# Patient Record
Sex: Female | Born: 1975 | Race: Black or African American | Hispanic: No | Marital: Single | Smoking: Current every day smoker
Health system: Southern US, Community
[De-identification: ages and names within clinical notes are randomized; demographics above are authoritative.]

## PROBLEM LIST (undated history)

## (undated) DIAGNOSIS — I10 Essential (primary) hypertension: Secondary | ICD-10-CM

## (undated) HISTORY — PX: TUBAL LIGATION: SHX77

---

## 2011-03-25 ENCOUNTER — Emergency Department (HOSPITAL_BASED_OUTPATIENT_CLINIC_OR_DEPARTMENT_OTHER)
Admission: EM | Admit: 2011-03-25 | Discharge: 2011-03-25 | Disposition: A | Discharge status: Home / Self Care | Payer: Medicaid Other | Attending: Emergency Medicine | Admitting: Emergency Medicine

## 2011-03-25 DIAGNOSIS — F17200 Nicotine dependence, unspecified, uncomplicated: Secondary | ICD-10-CM | POA: Insufficient documentation

## 2011-03-25 DIAGNOSIS — J329 Chronic sinusitis, unspecified: Secondary | ICD-10-CM | POA: Insufficient documentation

## 2016-05-05 ENCOUNTER — Encounter (HOSPITAL_BASED_OUTPATIENT_CLINIC_OR_DEPARTMENT_OTHER): Payer: Self-pay | Admitting: *Deleted

## 2016-05-05 ENCOUNTER — Emergency Department (HOSPITAL_BASED_OUTPATIENT_CLINIC_OR_DEPARTMENT_OTHER)
Admission: EM | Admit: 2016-05-05 | Discharge: 2016-05-05 | Disposition: A | Discharge status: Home / Self Care | Payer: Medicaid Other | Attending: Emergency Medicine | Admitting: Emergency Medicine

## 2016-05-05 DIAGNOSIS — Y939 Activity, unspecified: Secondary | ICD-10-CM | POA: Diagnosis not present

## 2016-05-05 DIAGNOSIS — S91311A Laceration without foreign body, right foot, initial encounter: Secondary | ICD-10-CM | POA: Diagnosis not present

## 2016-05-05 DIAGNOSIS — W450XXA Nail entering through skin, initial encounter: Secondary | ICD-10-CM | POA: Diagnosis not present

## 2016-05-05 DIAGNOSIS — Y999 Unspecified external cause status: Secondary | ICD-10-CM | POA: Insufficient documentation

## 2016-05-05 DIAGNOSIS — F17210 Nicotine dependence, cigarettes, uncomplicated: Secondary | ICD-10-CM | POA: Diagnosis not present

## 2016-05-05 DIAGNOSIS — L0291 Cutaneous abscess, unspecified: Secondary | ICD-10-CM

## 2016-05-05 DIAGNOSIS — L02411 Cutaneous abscess of right axilla: Secondary | ICD-10-CM | POA: Insufficient documentation

## 2016-05-05 DIAGNOSIS — IMO0002 Reserved for concepts with insufficient information to code with codable children: Secondary | ICD-10-CM

## 2016-05-05 DIAGNOSIS — I10 Essential (primary) hypertension: Secondary | ICD-10-CM | POA: Insufficient documentation

## 2016-05-05 DIAGNOSIS — Y929 Unspecified place or not applicable: Secondary | ICD-10-CM | POA: Diagnosis not present

## 2016-05-05 HISTORY — DX: Essential (primary) hypertension: I10

## 2016-05-05 MED ORDER — SULFAMETHOXAZOLE-TRIMETHOPRIM 800-160 MG PO TABS: 1.0000 | ORAL_TABLET | Freq: Two times a day (BID) | ORAL | Status: AC

## 2016-05-05 MED ORDER — LIDOCAINE-EPINEPHRINE (PF) 2 %-1:200000 IJ SOLN
10.0000 mL | Freq: Once | INTRAMUSCULAR | Status: AC
  Administered 2016-05-05: 10 mL
  Filled 2016-05-05: qty 10

## 2016-05-05 MED ORDER — TETANUS-DIPHTH-ACELL PERTUSSIS 5-2.5-18.5 LF-MCG/0.5 IM SUSP
0.5000 mL | Freq: Once | INTRAMUSCULAR | Status: AC
  Administered 2016-05-05: 0.5 mL via INTRAMUSCULAR
  Filled 2016-05-05: qty 0.5

## 2016-05-05 NOTE — Discharge Instructions (Signed)
Keep wound clean using Dial antibacterial soap and water and pat dry.  You may take Ibuprofen and/or Tylenol as prescribed over the counter as needed for pain relief. You may continue using warm compresses as needed. Please follow up with a primary care provider from the Resource Guide provided below in 4-5 days if your symptoms have not improved. Please return to the Emergency Department if symptoms worsen or new onset of fever, swelling, redness, drainage, numbness, tingling.

## 2016-05-05 NOTE — ED Notes (Signed)
Patient states she has a three day old cut on the heel of her right foot from a nail in her bed frame.  Also has an axillary abscess under her right arm.

## 2016-05-05 NOTE — ED Provider Notes (Signed)
CSN: [id number]     Arrival date & time 05/05/16  1025 History   First MD Initiated Contact with Patient 05/05/16 1110     Chief Complaint  Patient presents with  . Extremity Laceration  . Abscess     (Consider location/radiation/quality/duration/timing/severity/associated sxs/prior Treatment) HPI   Patient is a 40 year old female with past medical history of hypertension who presents the ED with complaint of abscess. Patient reports having a worsening abscess to her right axilla. Patient endorses history of abscesses to her axillary region and groin. Denies fever, chills, swelling, redness, drainage, numbness, tingling. Patient denies taking any medications at home are using warm compresses. She also reports cutting her right heel on a nail that was sticking out from her bed frame 3 days ago. She states the wound was initially bleeding but was controlled with pressure. Patient reports cleaning her wound with water and peroxide. Endorses mild associated pain to the area. Denies bleeding, drainage, swelling, redness. Tetanus status unknown.   Past Medical History  Diagnosis Date  . Hypertension     Gestational htn   Past Surgical History  Procedure Laterality Date  . Cesarean section    . Tubal ligation     No family history on file. Social History  Substance Use Topics  . Smoking status: Current Every Day Smoker    Types: Cigarettes  . Smokeless tobacco: Never Used  . Alcohol Use: No   OB History    No data available     Review of Systems  Constitutional: Negative for fever.  Musculoskeletal: Negative for joint swelling.  Skin: Positive for wound (laceration).       Abscess  Neurological: Negative for numbness.      Allergies  Review of patient's allergies indicates no known allergies.  Home Medications   Prior to Admission medications   Not on File   BP 159/95 mmHg  Pulse 86  Temp(Src) 99.5 F (37.5 C) (Oral)  Resp 20  Ht 5\' 9"  (1.753 m)  Wt 68.04 kg   BMI 22.14 kg/m2  SpO2 100%  LMP 04/14/2016 (Exact Date) Physical Exam  Constitutional: She is oriented to person, place, and time. She appears well-developed and well-nourished.  HENT:  Head: Normocephalic and atraumatic.  Eyes: Conjunctivae and EOM are normal. Right eye exhibits no discharge. Left eye exhibits no discharge. No scleral icterus.  Neck: Normal range of motion. Neck supple.  Cardiovascular: Normal rate.   Pulmonary/Chest: Effort normal.  Abdominal: Soft. She exhibits no distension.  Musculoskeletal: Normal range of motion. She exhibits no edema or tenderness.       Right foot: There is laceration (healing). There is normal range of motion, no tenderness, no bony tenderness, no swelling, normal capillary refill, no crepitus and no deformity.       Feet:  FROM of right foot, ankle, toes. Sensation grossly intact. 2+ DP pulse. No swelling or contusion noted. No active bleeding or drainage.  Neurological: She is alert and oriented to person, place, and time.  Skin: Skin is warm and dry.  2x1cm abscess noted to right axilla with fluctuance and induration, no active drainage. No surrounding swelling or erythema noted.  Nursing note and vitals reviewed.   ED Course  .Marland Kitchen.Incision and Drainage Date/Time: 05/05/2016 12:39 PM Performed by: Barrett HenleNADEAU, NICOLE ELIZABETH Authorized by: Barrett HenleNADEAU, NICOLE ELIZABETH Consent: Verbal consent obtained. Risks and benefits: risks, benefits and alternatives were discussed Consent given by: patient Patient understanding: patient states understanding of the procedure being performed Patient identity confirmed: verbally  with patient Type: abscess Location: right axillary. Anesthesia: local infiltration Local anesthetic: lidocaine 2% with epinephrine Anesthetic total: 3 ml Scalpel size: 11 Needle gauge: 23. Incision type: single straight Complexity: simple Drainage: purulent Drainage amount: moderate Wound treatment: wound left open Packing  material: 1/2 in gauze Patient tolerance: Patient tolerated the procedure well with no immediate complications   (including critical care time) Labs Review Labs Reviewed - No data to display  Imaging Review No results found. I have personally reviewed and evaluated these images and lab results as part of my medical decision-making.   EKG Interpretation None      MDM   Final diagnoses:  Abscess  Laceration    Patient with skin abscess amenable to incision and drainage.  Abscess was not large enough to warrant packing or drain,  wound recheck in 2 days. Pt given info to follow up with PCP outpatient. Encouraged home warm soaks and flushing.  No signs of cellulitis is surrounding skin however, due to pt with hx of abscesses will d/c home with antibiotics. Plan to discharge patient home with wound care and discussed return precautions.  Patient presents with laceration that occurred over 3 days ago. Laceration is currently closed and appears to be healing well. Pt endorses cleaning wound with peroxide after onset. Due to laceration occurring more than 8 hours ago, laceration repair is contraindicated at this time. Tdap updated.  Pt has no comorbidities to effect normal wound healing. Discussed wound home care with patient and answered questions. Advised patient to follow up with a PCP as needed for her wound.    Satira Sarkicole Elizabeth DouglasNadeau, [place] JerseyPA-C 05/05/16 1251  Loren Raceravid Yelverton, MD 05/11/16 [id number] [id number]

## 2016-05-05 NOTE — ED Notes (Addendum)
Pt states she has been prescribed BP medication but does not take it.

## 2017-12-21 ENCOUNTER — Emergency Department (HOSPITAL_BASED_OUTPATIENT_CLINIC_OR_DEPARTMENT_OTHER)
Admission: EM | Admit: 2017-12-21 | Discharge: 2017-12-21 | Disposition: A | Discharge status: Home / Self Care | Payer: Self-pay | Attending: Physician Assistant | Admitting: Physician Assistant

## 2017-12-21 ENCOUNTER — Other Ambulatory Visit: Payer: Self-pay

## 2017-12-21 ENCOUNTER — Emergency Department (HOSPITAL_BASED_OUTPATIENT_CLINIC_OR_DEPARTMENT_OTHER): Payer: Self-pay

## 2017-12-21 ENCOUNTER — Encounter (HOSPITAL_BASED_OUTPATIENT_CLINIC_OR_DEPARTMENT_OTHER): Payer: Self-pay | Admitting: Emergency Medicine

## 2017-12-21 DIAGNOSIS — Y999 Unspecified external cause status: Secondary | ICD-10-CM | POA: Insufficient documentation

## 2017-12-21 DIAGNOSIS — X58XXXA Exposure to other specified factors, initial encounter: Secondary | ICD-10-CM | POA: Insufficient documentation

## 2017-12-21 DIAGNOSIS — T148XXA Other injury of unspecified body region, initial encounter: Secondary | ICD-10-CM

## 2017-12-21 DIAGNOSIS — Y929 Unspecified place or not applicable: Secondary | ICD-10-CM | POA: Insufficient documentation

## 2017-12-21 DIAGNOSIS — S29012A Strain of muscle and tendon of back wall of thorax, initial encounter: Secondary | ICD-10-CM | POA: Insufficient documentation

## 2017-12-21 DIAGNOSIS — J069 Acute upper respiratory infection, unspecified: Secondary | ICD-10-CM | POA: Insufficient documentation

## 2017-12-21 DIAGNOSIS — Y939 Activity, unspecified: Secondary | ICD-10-CM | POA: Insufficient documentation

## 2017-12-21 DIAGNOSIS — F17210 Nicotine dependence, cigarettes, uncomplicated: Secondary | ICD-10-CM | POA: Insufficient documentation

## 2017-12-21 DIAGNOSIS — B9789 Other viral agents as the cause of diseases classified elsewhere: Secondary | ICD-10-CM | POA: Insufficient documentation

## 2017-12-21 DIAGNOSIS — I10 Essential (primary) hypertension: Secondary | ICD-10-CM | POA: Insufficient documentation

## 2017-12-21 MED ORDER — BENZONATATE 100 MG PO CAPS: 100.0000 mg | ORAL_CAPSULE | Freq: Three times a day (TID) | ORAL | 0 refills | Status: DC | PRN

## 2017-12-21 MED ORDER — CYCLOBENZAPRINE HCL 10 MG PO TABS: 10.0000 mg | ORAL_TABLET | Freq: Every evening | ORAL | 0 refills | Status: AC | PRN

## 2017-12-21 NOTE — ED Notes (Signed)
Pt c/o mid back pain onset last p.m. "after work". Denies injury, but has had a cough for several days. Non productive. FAmily members also sick. PA at bedside.

## 2017-12-21 NOTE — ED Triage Notes (Signed)
Pt reports midback pain (greater on left) that began yesterday - states she thinks she hurt it at work but cannot recall - pt states no workers comp. Associated cough. Denies fever or dysuria.

## 2017-12-21 NOTE — ED Notes (Signed)
ED Provider at bedside. 

## 2017-12-21 NOTE — Discharge Instructions (Signed)
Please read instructions below. Apply ice or heat to your back for 20 minutes at a time. You can take advil every 6 hours as needed for pain. You can take flexeril as needed for muscle spasm. This medication can make you drowsy. You can take tessalon every 8 hours as needed for cough. Follow up with your primary care provider if symptoms persist.  Return to the ER for new or concerning symptoms.

## 2017-12-21 NOTE — ED Provider Notes (Signed)
[facility] EMERGENCY DEPARTMENT Provider Note   CSN: [id number] Arrival date & time: 12/21/17  1554     History   Chief Complaint Chief Complaint  Patient presents with  . Back Pain    HPI Donna Sanchez is a 42 y.o. female with past medical history of hypertension, presenting to the ED for acute onset of left-sided mid back pain that began yesterday.  Patient states she has had a cold for the past week, which she got from her children, and has been coughing frequently.  She states upon leaving work yesterday she began feeling sore in her left back that is been persistent since that time.  No known injuries that she can recall.  States cough is dry and nonproductive.  Denies fever, difficulty breathing, unilateral leg swelling, history of clot, exogenous estrogen use, recent travel or surgery.  The history is provided by the patient.    Past Medical History:  Diagnosis Date  . Hypertension    Gestational htn    There are no active problems to display for this patient.   Past Surgical History:  Procedure Laterality Date  . CESAREAN SECTION    . TUBAL LIGATION      OB History    No data available       Home Medications    Prior to Admission medications   Medication Sig Start Date End Date Taking? Authorizing Provider  benzonatate (TESSALON) 100 MG capsule Take 1 capsule (100 mg total) by mouth 3 (three) times daily as needed for cough. 12/21/17   Kais Monje, [place] N, PA-C  cyclobenzaprine (FLEXERIL) 10 MG tablet Take 1 tablet (10 mg total) by mouth at bedtime as needed for muscle spasms. 12/21/17   Arren Laminack, [place] N, PA-C    Family History History reviewed. No pertinent family history.  Social History Social History   Tobacco Use  . Smoking status: Current Every Day Smoker    Types: Cigarettes  . Smokeless tobacco: Never Used  Substance Use Topics  . Alcohol use: No  . Drug use: No     Allergies   Patient has no known allergies.   Review  of Systems Review of Systems  Constitutional: Negative for chills and fever.  Respiratory: Positive for cough. Negative for shortness of breath.   Cardiovascular: Negative for chest pain and leg swelling.  Musculoskeletal: Positive for back pain.  All other systems reviewed and are negative.    Physical Exam Updated Vital Signs BP (!) 145/97 (BP Location: Right Arm)   Pulse 82   Temp 98.3 F (36.8 C) (Oral)   Resp 18   Ht 5\' 9"  (1.753 m)   Wt 65.8 kg (145 lb)   LMP 12/21/2017   SpO2 100%   BMI 21.41 kg/m   Physical Exam  Constitutional: She appears well-developed and well-nourished. No distress.  Well-appearing, no inc work of breathing.  HENT:  Head: Normocephalic and atraumatic.  Eyes: Conjunctivae are normal.  Cardiovascular: Normal rate, regular rhythm, normal heart sounds and intact distal pulses.  Pulmonary/Chest: Effort normal and breath sounds normal. No stridor. No respiratory distress. She has no wheezes. She has no rales.  Musculoskeletal:  No spinal or paraspinal tenderness, no bony step-offs or gross deformities.  Muscular tenderness involving distal portion of left trapezius and right trapezius.  Neck with normal range of motion, bilateral upper extremities with normal range of motion, equal strength. No Lower extremity edema or tenderness.  Psychiatric: She has a normal mood and affect. Her behavior is  normal.  Nursing note and vitals reviewed.    ED Treatments / Results  Labs (all labs ordered are listed, but only abnormal results are displayed) Labs Reviewed - No data to display  EKG  EKG Interpretation None       Radiology Dg Chest 2 View  Result Date: 12/21/2017 CLINICAL DATA:  Smoker with cough x 1 week; no fever; back pain when coughing; no h/o lung or heart problems EXAM: CHEST  2 VIEW COMPARISON:  None. FINDINGS: Normal mediastinum and cardiac silhouette. Normal pulmonary vasculature. No evidence of effusion, infiltrate, or pneumothorax. No  acute bony abnormality. IMPRESSION: Normal chest radiograph. Electronically Signed   By: Genevive Bi M.D.   On: 12/21/2017 17:05    Procedures Procedures (including critical care time)  Medications Ordered in ED Medications - No data to display   Initial Impression / Assessment and Plan / ED Course  I have reviewed the triage vital signs and the nursing notes.  Pertinent labs & imaging results that were available during my care of the patient were reviewed by me and considered in my medical decision making (see chart for details).     Patient presenting with mid back pain, likely muscular strain secondary to viral upper respiratory infection with cough.  PERC negative, VSS.  No red flags on exam. Muscular tenderness noted, no midline spinal tenderness. Chest x-ray negative.  Discharge with symptomatic management including muscle relaxer and antitussive.  Well-appearing, safe for discharge.  Discussed results, findings, treatment and follow up. Patient advised of return precautions. Patient verbalized understanding and agreed with plan.  Final Clinical Impressions(s) / ED Diagnoses   Final diagnoses:  Muscle strain  Viral URI with cough    ED Discharge Orders        Ordered    cyclobenzaprine (FLEXERIL) 10 MG tablet  At bedtime PRN     12/21/17 1848    benzonatate (TESSALON) 100 MG capsule  3 times daily PRN     12/21/17 1848       Alaine Loughney, [place] N, PA-C 12/21/17 1848    Abelino Derrick, MD 12/21/17 2357

## 2018-04-29 ENCOUNTER — Other Ambulatory Visit: Payer: Self-pay

## 2018-04-29 ENCOUNTER — Encounter (HOSPITAL_BASED_OUTPATIENT_CLINIC_OR_DEPARTMENT_OTHER): Payer: Self-pay | Admitting: Adult Health

## 2018-04-29 ENCOUNTER — Emergency Department (HOSPITAL_BASED_OUTPATIENT_CLINIC_OR_DEPARTMENT_OTHER)
Admission: EM | Admit: 2018-04-29 | Discharge: 2018-04-29 | Disposition: A | Discharge status: Home / Self Care | Payer: Self-pay | Attending: Emergency Medicine | Admitting: Emergency Medicine

## 2018-04-29 ENCOUNTER — Emergency Department (HOSPITAL_BASED_OUTPATIENT_CLINIC_OR_DEPARTMENT_OTHER): Payer: Self-pay

## 2018-04-29 DIAGNOSIS — F17210 Nicotine dependence, cigarettes, uncomplicated: Secondary | ICD-10-CM | POA: Insufficient documentation

## 2018-04-29 DIAGNOSIS — J181 Lobar pneumonia, unspecified organism: Secondary | ICD-10-CM

## 2018-04-29 DIAGNOSIS — J189 Pneumonia, unspecified organism: Secondary | ICD-10-CM | POA: Insufficient documentation

## 2018-04-29 DIAGNOSIS — I10 Essential (primary) hypertension: Secondary | ICD-10-CM | POA: Insufficient documentation

## 2018-04-29 MED ORDER — DOXYCYCLINE HYCLATE 100 MG PO CAPS: 100.0000 mg | ORAL_CAPSULE | Freq: Two times a day (BID) | ORAL | 0 refills | Status: AC

## 2018-04-29 NOTE — ED Notes (Signed)
Patient transported to X-ray 

## 2018-04-29 NOTE — ED Triage Notes (Signed)
PResents with chills, bodyaches, cough and fatigue that began last night. Cough is productive with thick yellow mucous.

## 2018-04-29 NOTE — ED Notes (Signed)
ED Provider at bedside. 

## 2018-04-29 NOTE — ED Notes (Signed)
Pt reports increased fatigue and possible fevers where she is hott/cold.

## 2018-04-29 NOTE — ED Provider Notes (Signed)
[facility] EMERGENCY DEPARTMENT Provider Note   CSN: [id number] Arrival date & time: 04/29/18  1842     History   Chief Complaint Chief Complaint  Patient presents with  . Cough    HPI Donna Sanchez is a 42 y.o. female.  HPI Patient has a cough and felt bad since yesterday.  Fatigue body ache and chills.  Cough has sputum production that is "sweet".  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  Decreased appetite.  Dull chest pain.  But states that she aches everywhere.  No swelling in her legs. Past Medical History:  Diagnosis Date  . Hypertension    Gestational htn    There are no active problems to display for this patient.   Past Surgical History:  Procedure Laterality Date  . CESAREAN SECTION    . TUBAL LIGATION       OB History   None      Home Medications    Prior to Admission medications   Medication Sig Start Date End Date Taking? Authorizing Provider  benzonatate (TESSALON) 100 MG capsule Take 1 capsule (100 mg total) by mouth 3 (three) times daily as needed for cough. 12/21/17   Robinson, [place] N, PA-C  cyclobenzaprine (FLEXERIL) 10 MG tablet Take 1 tablet (10 mg total) by mouth at bedtime as needed for muscle spasms. 12/21/17   Robinson, [place] N, PA-C  doxycycline (VIBRAMYCIN) 100 MG capsule Take 1 capsule (100 mg total) by mouth 2 (two) times daily. 04/29/18   Benjiman CorePickering, Neo Yepiz, MD    Family History History reviewed. No pertinent family history.  Social History Social History   Tobacco Use  . Smoking status: Current Every Day Smoker    Types: Cigarettes  . Smokeless tobacco: Never Used  Substance Use Topics  . Alcohol use: No  . Drug use: No     Allergies   Patient has no known allergies.   Review of Systems Review of Systems  Constitutional: Positive for appetite change, chills and fatigue.  HENT: Negative for congestion.   Eyes: Negative for visual disturbance.  Respiratory: Positive for cough.     Cardiovascular: Negative for leg swelling.  Gastrointestinal: Negative for anal bleeding.  Endocrine: Negative for polyuria.  Genitourinary: Negative for flank pain.  Musculoskeletal: Positive for myalgias.  Skin: Negative for rash.  Neurological: Positive for weakness.  Hematological: Negative for adenopathy.  Psychiatric/Behavioral: Negative for confusion.     Physical Exam Updated Vital Signs BP (!) 143/84   Pulse (!) 102   Temp 99.8 F (37.7 C) (Oral)   Resp 17   Ht 5\' 9"  (1.753 m)   Wt 67.1 kg (148 lb)   LMP 04/20/2018 (Approximate)   SpO2 100%   BMI 21.86 kg/m   Physical Exam  Constitutional: She appears well-developed.  HENT:  Head: Normocephalic.  Eyes: EOM are normal.  Neck: Neck supple.  Cardiovascular:  Mild tachycardia  Pulmonary/Chest:  Mildly harsh breath sounds diffusely, scattered wheezes.  Abdominal: There is no tenderness.  Musculoskeletal: She exhibits no edema.  Neurological: She is alert.  Skin: Skin is warm. Capillary refill takes less than 2 seconds.     ED Treatments / Results  Labs (all labs ordered are listed, but only abnormal results are displayed) Labs Reviewed - No data to display  EKG None  Radiology Dg Chest 2 View  Result Date: 04/29/2018 CLINICAL DATA:  Cough and fever with body aches. EXAM: CHEST - 2 VIEW COMPARISON:  12/21/2017  FINDINGS: The heart size and mediastinal contours are within normal limits. Pulmonary consolidation in the medial right middle lobe. No parapneumonic effusion. No pneumothorax. The visualized skeletal structures are unremarkable. IMPRESSION: Right middle lobe pneumonia. Electronically Signed   By: Tollie Eth M.D.   On: 04/29/2018 19:34    Procedures Procedures (including critical care time)  Medications Ordered in ED Medications - No data to display   Initial Impression / Assessment and Plan / ED Course  I have reviewed the triage vital signs and the nursing notes.  Pertinent labs &  imaging results that were available during my care of the patient were reviewed by me and considered in my medical decision making (see chart for details).     Patient with chills cough and apparent pneumonia on x-ray.  Otherwise healthy.  Will discharge with antibiotics.  Not hypoxic.  Final Clinical Impressions(s) / ED Diagnoses   Final diagnoses:  Community acquired pneumonia of right middle lobe of lung [facility])    ED Discharge Orders        Ordered    doxycycline (VIBRAMYCIN) 100 MG capsule  2 times daily     04/29/18 2025       Benjiman Core, MD 04/29/18 2038

## 2020-11-08 ENCOUNTER — Encounter (HOSPITAL_BASED_OUTPATIENT_CLINIC_OR_DEPARTMENT_OTHER): Payer: Self-pay | Admitting: *Deleted

## 2020-11-08 ENCOUNTER — Other Ambulatory Visit: Payer: Self-pay

## 2020-11-08 ENCOUNTER — Emergency Department (HOSPITAL_BASED_OUTPATIENT_CLINIC_OR_DEPARTMENT_OTHER)
Admission: EM | Admit: 2020-11-08 | Discharge: 2020-11-08 | Disposition: A | Discharge status: Home / Self Care | Payer: BLUE CROSS/BLUE SHIELD | Attending: Emergency Medicine | Admitting: Emergency Medicine

## 2020-11-08 ENCOUNTER — Emergency Department (HOSPITAL_BASED_OUTPATIENT_CLINIC_OR_DEPARTMENT_OTHER): Payer: BLUE CROSS/BLUE SHIELD

## 2020-11-08 DIAGNOSIS — U071 COVID-19: Secondary | ICD-10-CM | POA: Diagnosis not present

## 2020-11-08 DIAGNOSIS — I10 Essential (primary) hypertension: Secondary | ICD-10-CM | POA: Diagnosis not present

## 2020-11-08 DIAGNOSIS — F17210 Nicotine dependence, cigarettes, uncomplicated: Secondary | ICD-10-CM | POA: Insufficient documentation

## 2020-11-08 DIAGNOSIS — R0981 Nasal congestion: Secondary | ICD-10-CM | POA: Diagnosis present

## 2020-11-08 DIAGNOSIS — R509 Fever, unspecified: Secondary | ICD-10-CM

## 2020-11-08 MED ORDER — ACETAMINOPHEN 325 MG PO TABS
650.0000 mg | ORAL_TABLET | Freq: Once | ORAL | Status: AC
  Administered 2020-11-08: 650 mg via ORAL
  Filled 2020-11-08: qty 2

## 2020-11-08 MED ORDER — FLUTICASONE PROPIONATE 50 MCG/ACT NA SUSP: 2.0000 | Freq: Every day | NASAL | 0 refills | Status: AC

## 2020-11-08 NOTE — ED Triage Notes (Signed)
C/o covid symptoms and upper back pain x 5 days, pending COvid test

## 2020-11-08 NOTE — ED Provider Notes (Signed)
[facility] EMERGENCY DEPARTMENT Provider Note   CSN: [id number] Arrival date & time: 11/08/20  1207     History Chief Complaint  Patient presents with  . Fever    Donna Sanchez is a 44 y.o. female.  HPI    Pt is a 44 y/o female who presents to the ED today for eval of myaglias for the last 5 days. States pain has mostly improved but she still has some diffuse back pain. Pain is constant and worse with movement and certain positions. She also reports some congestion, fatigue, malaise, chills, and generalized weakness. Denies sore throat, cough, headaches, fevers, vomiting, diarrhea. Denies chest pain or pleuritic pain.   Has not had covid vaccine. Denies any known exposures to covid.   Denies leg pain/swelling, hemoptysis, recent surgery/trauma, recent long travel,  personal hx of cancer, or hx of DVT/PE. Is on the depo shot.    Past Medical History:  Diagnosis Date  . Hypertension    Gestational htn    There are no problems to display for this patient.   Past Surgical History:  Procedure Laterality Date  . CESAREAN SECTION    . TUBAL LIGATION       OB History   No obstetric history on file.     No family history on file.  Social History   Tobacco Use  . Smoking status: Current Every Day Smoker    Types: Cigarettes  . Smokeless tobacco: Never Used  Substance Use Topics  . Alcohol use: No  . Drug use: No    Home Medications Prior to Admission medications   Medication Sig Start Date End Date Taking? Authorizing Provider  fluticasone (FLONASE) 50 MCG/ACT nasal spray Place 2 sprays into both nostrils daily. 11/08/20  Yes Jalexus Brett S, PA-C  benzonatate (TESSALON) 100 MG capsule Take 1 capsule (100 mg total) by mouth 3 (three) times daily as needed for cough. 12/21/17   Robinson, [place] N, PA-C  cyclobenzaprine (FLEXERIL) 10 MG tablet Take 1 tablet (10 mg total) by mouth at bedtime as needed for muscle spasms. 12/21/17   Robinson, [place] N,  PA-C  doxycycline (VIBRAMYCIN) 100 MG capsule Take 1 capsule (100 mg total) by mouth 2 (two) times daily. 04/29/18   Benjiman Core, MD    Allergies    Patient has no known allergies.  Review of Systems   Review of Systems  Constitutional: Positive for chills and fatigue. Negative for fever.  HENT: Positive for congestion. Negative for ear pain and sore throat.   Eyes: Negative for pain and visual disturbance.  Respiratory: Negative for cough and shortness of breath.   Cardiovascular: Negative for chest pain.  Gastrointestinal: Negative for abdominal pain, constipation, diarrhea, nausea and vomiting.  Genitourinary: Negative for dysuria and hematuria.  Musculoskeletal: Positive for back pain and myalgias.  Skin: Negative for rash.  Neurological: Negative for weakness and numbness.  All other systems reviewed and are negative.   Physical Exam Updated Vital Signs BP 131/81   Pulse 94   Temp (!) 101.9 F (38.8 C) (Oral)   Resp 20   Ht 5\' 9"  (1.753 m)   Wt 66.2 kg   LMP 11/01/2020   SpO2 100%   BMI 21.56 kg/m   Physical Exam Vitals and nursing note reviewed.  Constitutional:      General: She is not in acute distress.    Appearance: She is well-developed and well-nourished.  HENT:     Head: Normocephalic and atraumatic.  Eyes:  Conjunctiva/sclera: Conjunctivae normal.  Cardiovascular:     Rate and Rhythm: Normal rate and regular rhythm.     Heart sounds: Normal heart sounds. No murmur heard.   Pulmonary:     Effort: Pulmonary effort is normal. No respiratory distress.     Breath sounds: Normal breath sounds. No wheezing, rhonchi or rales.  Abdominal:     Palpations: Abdomen is soft.     Tenderness: There is no abdominal tenderness.  Musculoskeletal:        General: No edema.     Cervical back: Neck supple.  Skin:    General: Skin is warm and dry.  Neurological:     Mental Status: She is alert.  Psychiatric:        Mood and Affect: Mood and affect  normal.     ED Results / Procedures / Treatments   Labs (all labs ordered are listed, but only abnormal results are displayed) Labs Reviewed  SARS CORONAVIRUS 2 (TAT 6-24 HRS)    EKG None  Radiology DG Chest Portable 1 View  Result Date: 11/08/2020 CLINICAL DATA:  Fever, back pain EXAM: PORTABLE CHEST 1 VIEW COMPARISON:  06/08/2019 FINDINGS: The heart size and mediastinal contours are within normal limits. Both lungs are clear. The visualized skeletal structures are unremarkable. IMPRESSION: No active disease. Electronically Signed   By: Elige Ko   On: 11/08/2020 13:34    Procedures Procedures (including critical care time)  Medications Ordered in ED Medications  acetaminophen (TYLENOL) tablet 650 mg (650 mg Oral Given 11/08/20 1732)    ED Course  I have reviewed the triage vital signs and the nursing notes.  Pertinent labs & imaging results that were available during my care of the patient were reviewed by me and considered in my medical decision making (see chart for details).    MDM Rules/Calculators/A&P                          Patient presenting for evaluation for Covid.  Reports symptoms ongoing for several days days.  Patient nontoxic, well-appearing, no distress.  Vital signs are reassuring.  Chest x-ray does not show pneumonia, ptx or other acute abnormality.  Tested for Covid in the ED. Results pending at the time of discharge. Advised on quarantine measures. Will give Rx for symptomatic management. Advised on f/u and return precautions. Pt voiced understanding of the plan and reasons to return. All questions answered, pt stable for d/c.  [name] was evaluated in Emergency Department on 11/08/2020 for the symptoms described in the history of present illness. She was evaluated in the context of the global COVID-19 pandemic, which necessitated consideration that the patient might be at risk for infection with the SARS-CoV-2 virus that causes COVID-19.  Institutional protocols and algorithms that pertain to the evaluation of patients at risk for COVID-19 are in a state of rapid change based on information released by regulatory bodies including the CDC and federal and state organizations. These policies and algorithms were followed during the patient's care in the ED.   Final Clinical Impression(s) / ED Diagnoses Final diagnoses:  Febrile illness    Rx / DC Orders ED Discharge Orders         Ordered    fluticasone (FLONASE) 50 MCG/ACT nasal spray  Daily        11/08/20 1743           Karrie Meres, PA-C 11/08/20 1743    Chaney Malling  Hsienta, MD 11/08/20 2312

## 2020-11-08 NOTE — Discharge Instructions (Addendum)
Rotate Tylenol and Motrin for fevers. Use Fluticasone for nasal congestion.   Your COVID test is pending at the time of your discharge. You can see this result on your mychart.   If positive, you should be isolated for at least 7 days since the onset of your symptoms AND >72 hours after symptoms resolution (absence of fever without the use of fever reducing medicaiton and improvement in respiratory symptoms), whichever is longer  Please follow up with your primary care provider within 5-7 days for re-evaluation of your symptoms. If you do not have a primary care provider, information for a healthcare clinic has been provided for you to make arrangements for follow up care. Please return to the emergency department for any new or worsening symptoms.

## 2020-11-09 LAB — SARS CORONAVIRUS 2 (TAT 6-24 HRS): SARS Coronavirus 2: POSITIVE — AB

## 2020-11-11 ENCOUNTER — Telehealth (HOSPITAL_COMMUNITY): Payer: Self-pay

## 2021-07-24 IMAGING — DX DG CHEST 1V PORT
2 series · 2 of 2 positions shown · non-contrast
Comparison: 06/08/2019

CLINICAL DATA: Fever, back pain

EXAM:
PORTABLE CHEST 1 VIEW

[chest ap (1 of 2)]
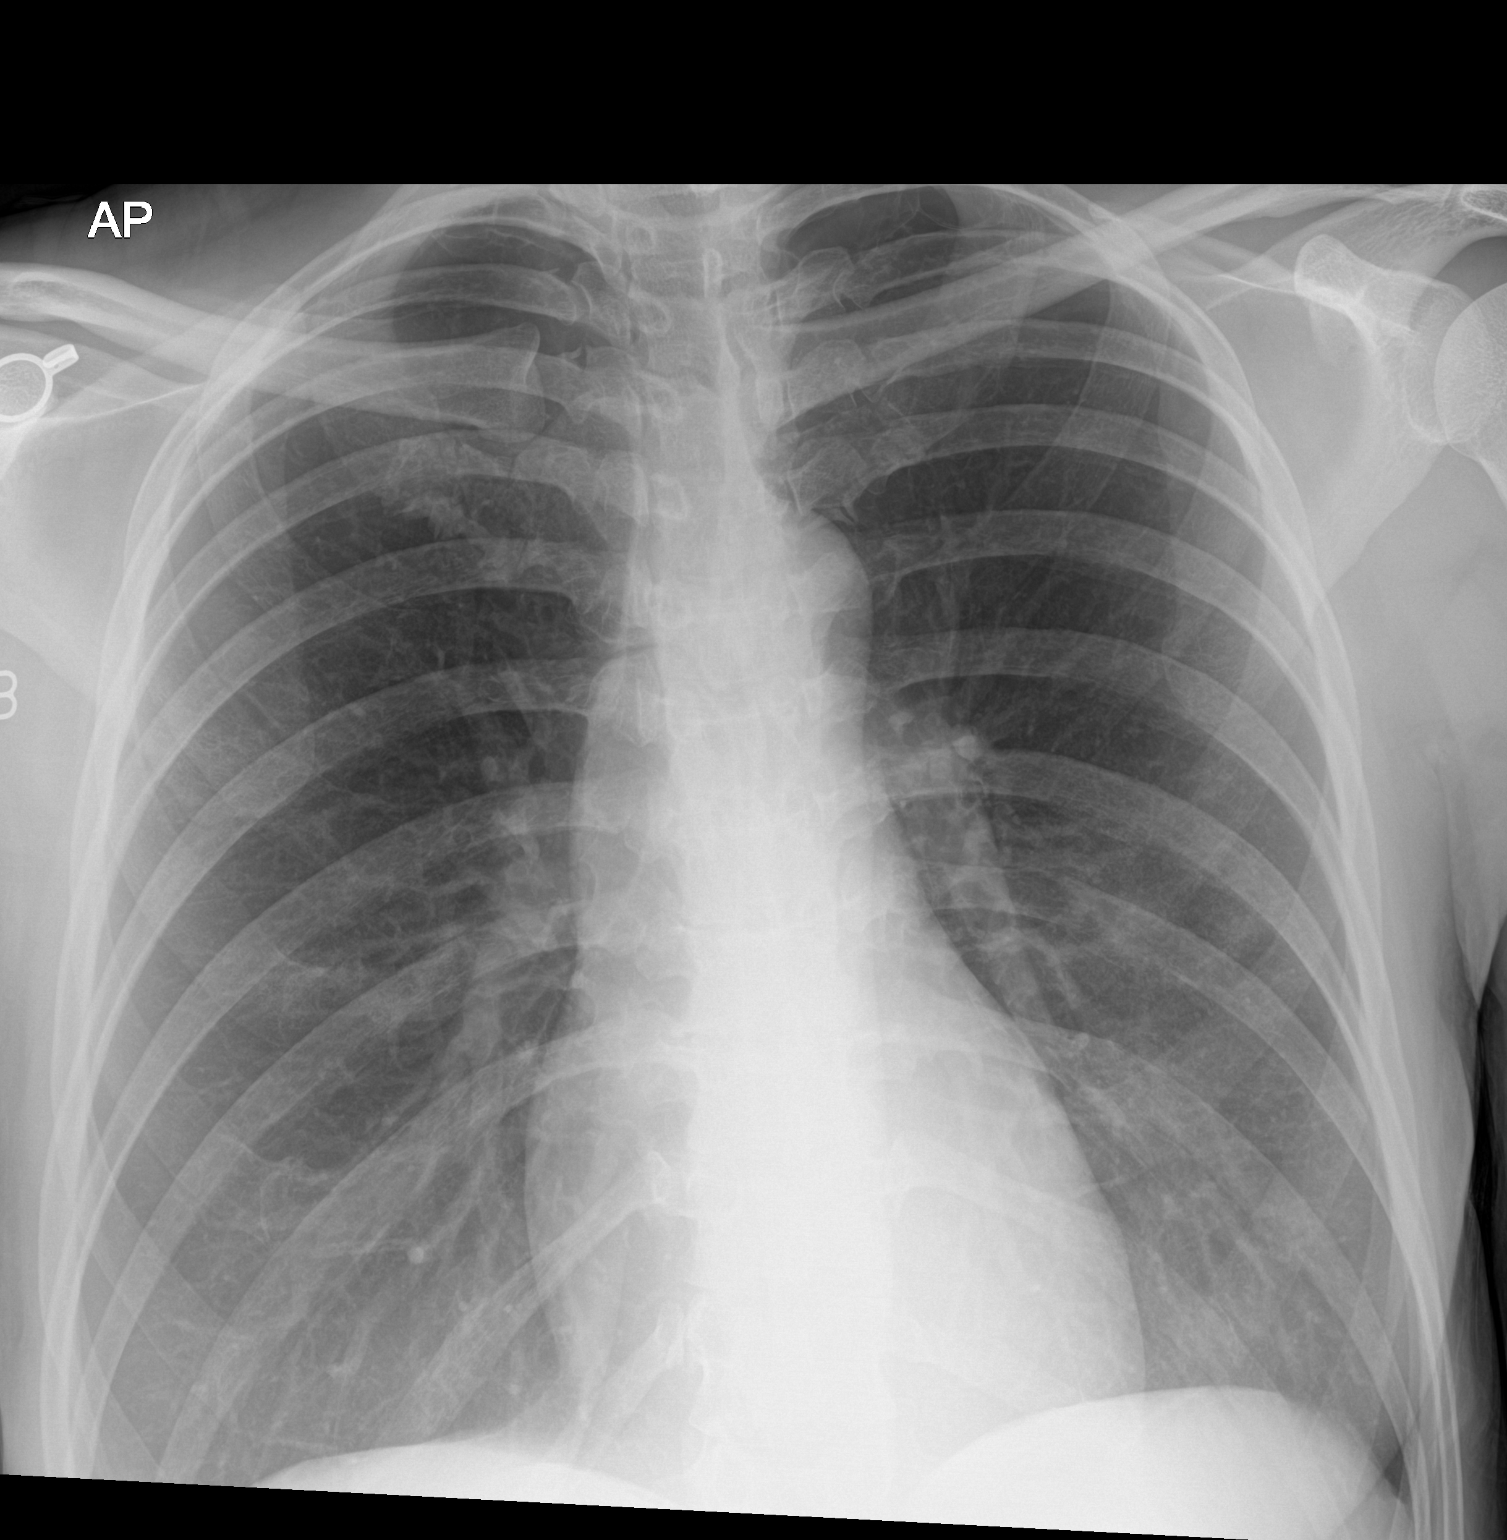

[chest ap (2 of 2)]
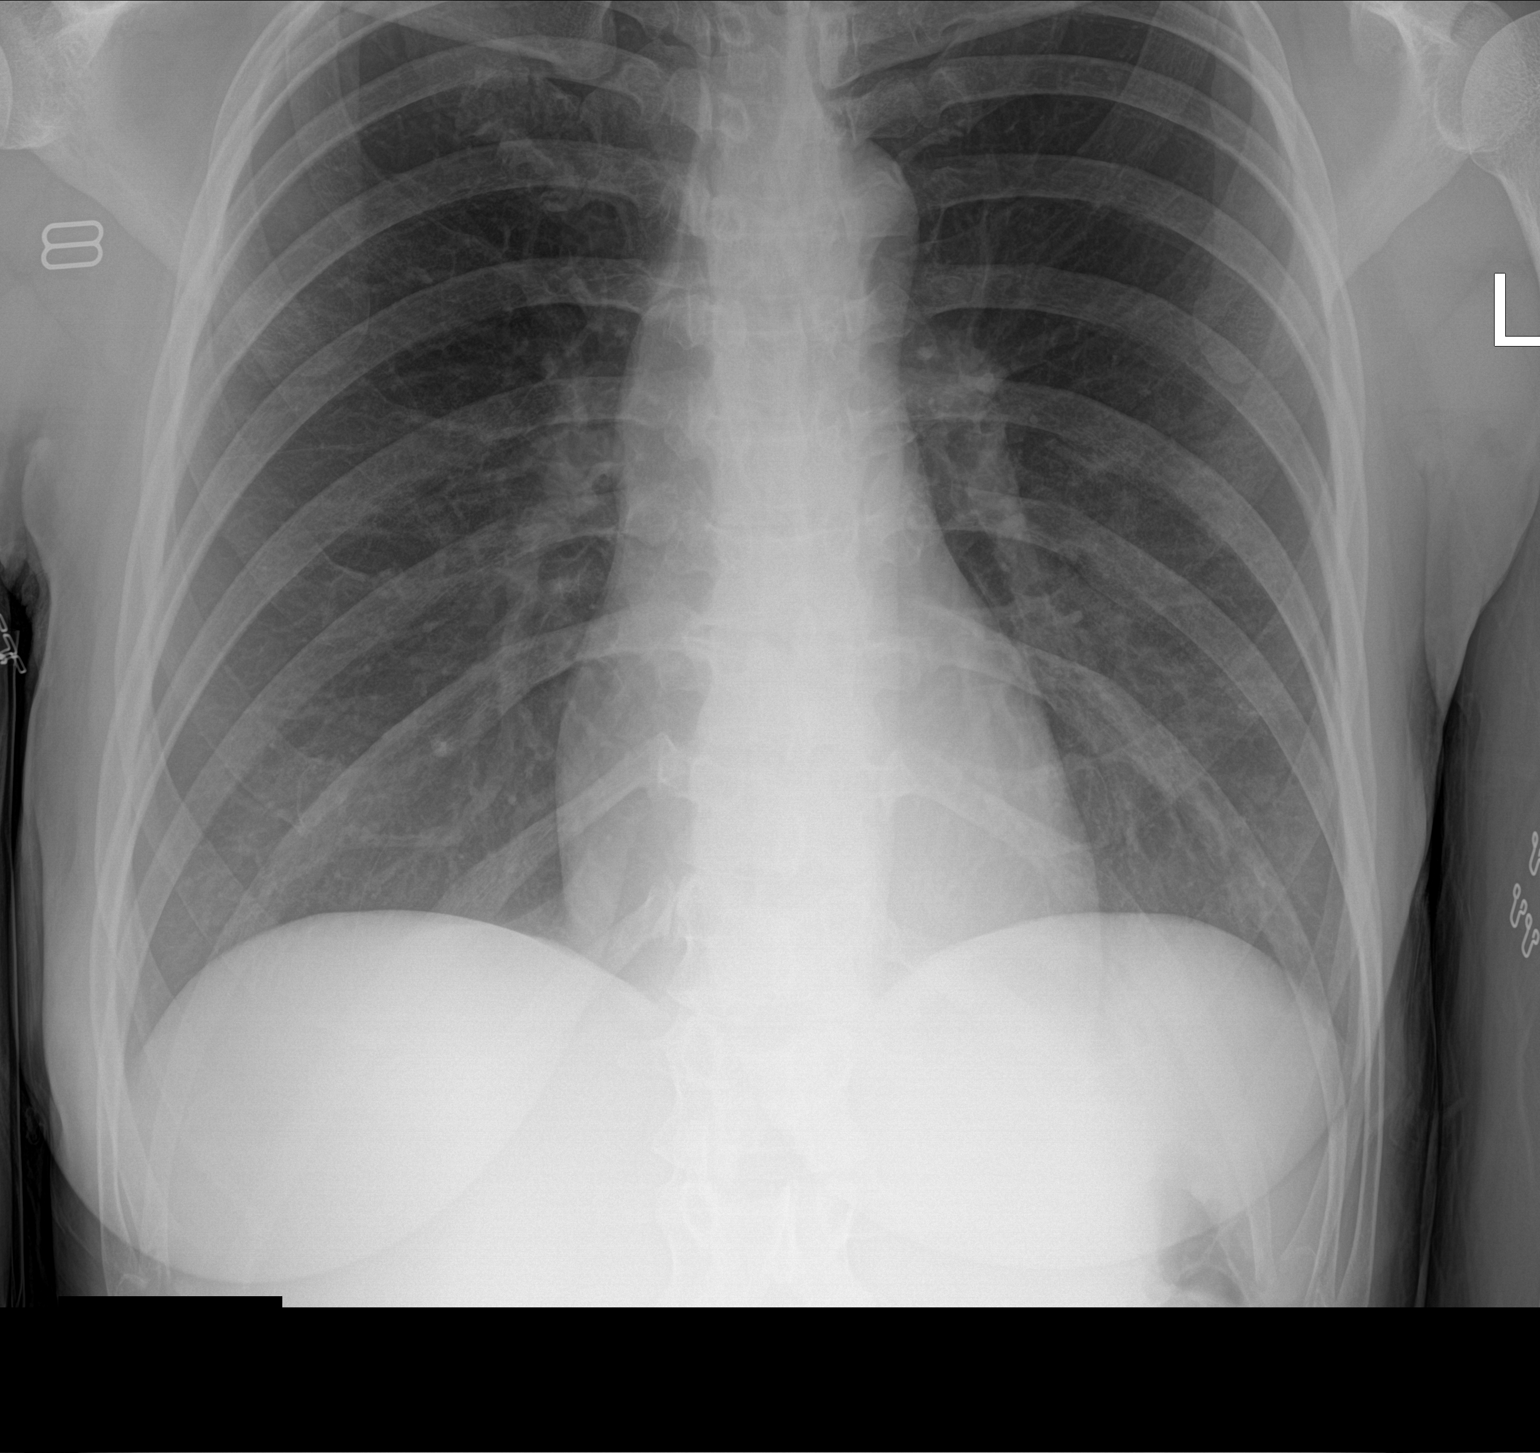

[2 of 2 positions shown; findings below may reference images not displayed]

FINDINGS: The heart size and mediastinal contours are within normal limits.
Both lungs are clear. The visualized skeletal structures are
unremarkable.
IMPRESSION: No active disease.

## 2024-01-04 ENCOUNTER — Encounter (HOSPITAL_BASED_OUTPATIENT_CLINIC_OR_DEPARTMENT_OTHER): Payer: Self-pay

## 2024-01-04 ENCOUNTER — Other Ambulatory Visit: Payer: Self-pay

## 2024-01-04 ENCOUNTER — Emergency Department (HOSPITAL_BASED_OUTPATIENT_CLINIC_OR_DEPARTMENT_OTHER)
Admission: EM | Admit: 2024-01-04 | Discharge: 2024-01-04 | Disposition: A | Discharge status: Home / Self Care | Payer: No Typology Code available for payment source | Attending: Emergency Medicine | Admitting: Emergency Medicine

## 2024-01-04 ENCOUNTER — Emergency Department (HOSPITAL_BASED_OUTPATIENT_CLINIC_OR_DEPARTMENT_OTHER): Payer: No Typology Code available for payment source

## 2024-01-04 DIAGNOSIS — F17200 Nicotine dependence, unspecified, uncomplicated: Secondary | ICD-10-CM | POA: Diagnosis not present

## 2024-01-04 DIAGNOSIS — I10 Essential (primary) hypertension: Secondary | ICD-10-CM | POA: Diagnosis not present

## 2024-01-04 DIAGNOSIS — R059 Cough, unspecified: Secondary | ICD-10-CM | POA: Diagnosis present

## 2024-01-04 DIAGNOSIS — J40 Bronchitis, not specified as acute or chronic: Secondary | ICD-10-CM | POA: Diagnosis not present

## 2024-01-04 DIAGNOSIS — Z7951 Long term (current) use of inhaled steroids: Secondary | ICD-10-CM | POA: Insufficient documentation

## 2024-01-04 LAB — RESP PANEL BY RT-PCR (RSV, FLU A&B, COVID)  RVPGX2
Influenza A by PCR: NEGATIVE
Influenza B by PCR: NEGATIVE
Resp Syncytial Virus by PCR: NEGATIVE
SARS Coronavirus 2 by RT PCR: NEGATIVE

## 2024-01-04 MED ORDER — ALBUTEROL SULFATE HFA 108 (90 BASE) MCG/ACT IN AERS
1.0000 | INHALATION_SPRAY | Freq: Once | RESPIRATORY_TRACT | Status: AC
  Administered 2024-01-04: 1 via RESPIRATORY_TRACT
  Filled 2024-01-04: qty 6.7

## 2024-01-04 MED ORDER — BENZONATATE 100 MG PO CAPS: 100.0000 mg | ORAL_CAPSULE | Freq: Three times a day (TID) | ORAL | 0 refills | Status: AC

## 2024-01-04 MED ORDER — PREDNISONE 20 MG PO TABS: 40.0000 mg | ORAL_TABLET | Freq: Every day | ORAL | 0 refills | Status: AC

## 2024-01-04 MED ORDER — ACETAMINOPHEN 500 MG PO TABS
1000.0000 mg | ORAL_TABLET | Freq: Once | ORAL | Status: AC
  Administered 2024-01-04: 1000 mg via ORAL
  Filled 2024-01-04: qty 2

## 2024-01-04 MED ORDER — AZITHROMYCIN 250 MG PO TABS: 250.0000 mg | ORAL_TABLET | Freq: Every day | ORAL | 0 refills | Status: AC

## 2024-01-04 NOTE — Discharge Instructions (Addendum)
 You were seen in emergency room for flulike symptoms.  Your x-ray here is reassuring without any pneumonia.  You have negative respiratory panel also no flu, COVID or RSV.  I would recommend staying well-hydrated with water he can alternate Pedialyte and Gatorade.  Take Tessalon Perles as needed for cough, take steroids as well. I have sent antibiotic to pharmacy. If you have fever you can take Tylenol 4 times a day 1000 mg.  He can also take ibuprofen. Use albuterol for shortness of breath or wheezing.  Return with new symptoms.

## 2024-01-04 NOTE — ED Triage Notes (Signed)
 Pt reports generalized body aches, cough and chills over the last week. Pt reports son has been sick with cough.

## 2024-01-04 NOTE — ED Provider Notes (Signed)
 [place] [facility] Provider Note   CSN: [id number] Arrival date & time: 01/04/24  1851     History  Chief Complaint  Patient presents with   Generalized Body Aches   Cough    Donna Sanchez is a 48 y.o. female patient with past medical history of hypertension reporting to emergency room with generalized bodyaches, cough and chills for approximately 10 days.  Patient reports that she had approximately 5 days of symptoms then started feeling better, she is now starting to have productive cough and fevers have returned.  Patient has recent sick contact to son although she is unsure what his son had.  She denies any chest pain, shortness of breath, abdominal pain nausea vomiting diarrhea.   Patient reports she has not been formally diagnosed with COPD however she is smoked a pack a day for 30 years.  She seems to have had symptoms of chronic bronchitis in the past.  She is a daily smoker.   Cough      Home Medications Prior to Admission medications   Medication Sig Start Date End Date Taking? Authorizing Provider  benzonatate (TESSALON) 100 MG capsule Take 1 capsule (100 mg total) by mouth 3 (three) times daily as needed for cough. 12/21/17   Robinson, [place] N, PA-C  cyclobenzaprine (FLEXERIL) 10 MG tablet Take 1 tablet (10 mg total) by mouth at bedtime as needed for muscle spasms. 12/21/17   Robinson, [place] N, PA-C  doxycycline (VIBRAMYCIN) 100 MG capsule Take 1 capsule (100 mg total) by mouth 2 (two) times daily. 04/29/18   Benjiman Core, MD  fluticasone (FLONASE) 50 MCG/ACT nasal spray Place 2 sprays into both nostrils daily. 11/08/20   Couture, Cortni S, PA-C      Allergies    Patient has no known allergies.    Review of Systems   Review of Systems  Respiratory:  Positive for cough.     Physical Exam Updated Vital Signs BP (!) 147/96 (BP Location: Right Arm)   Pulse (!) 102   Temp 99 F (37.2 C)   Resp 16   Ht 5\' 10"  (1.778  m)   Wt 71.7 kg   LMP  (Exact Date)   SpO2 99%   BMI 22.67 kg/m  Physical Exam Vitals and nursing note reviewed.  Constitutional:      General: She is not in acute distress.    Appearance: She is not toxic-appearing.  HENT:     Head: Normocephalic and atraumatic.  Eyes:     General: No scleral icterus.    Conjunctiva/sclera: Conjunctivae normal.  Cardiovascular:     Rate and Rhythm: Normal rate and regular rhythm.     Pulses: Normal pulses.     Heart sounds: Normal heart sounds.  Pulmonary:     Effort: Pulmonary effort is normal. No respiratory distress.     Breath sounds: Normal breath sounds.  Abdominal:     General: Abdomen is flat. Bowel sounds are normal.     Palpations: Abdomen is soft.     Tenderness: There is no abdominal tenderness.  Musculoskeletal:     Right lower leg: No edema.     Left lower leg: No edema.  Skin:    General: Skin is warm and dry.     Findings: No lesion.  Neurological:     General: No focal deficit present.     Mental Status: She is alert and oriented to person, place, and time. Mental status is at baseline.  ED Results / Procedures / Treatments   Labs (all labs ordered are listed, but only abnormal results are displayed) Labs Reviewed  RESP PANEL BY RT-PCR (RSV, FLU A&B, COVID)  [id number]    EKG None  Radiology DG Chest 2 View Result Date: 01/04/2024 CLINICAL DATA:  Cough, back pain EXAM: CHEST - 2 VIEW COMPARISON:  10/20/2021 FINDINGS: The heart size and mediastinal contours are within normal limits. Both lungs are clear. The visualized skeletal structures are unremarkable. IMPRESSION: No active cardiopulmonary disease. Electronically Signed   By: Duanne Guess D.O.   On: 01/04/2024 20:36    Procedures Procedures    Medications Ordered in ED Medications - No data to display  ED Course/ Medical Decision Making/ A&P                                 Medical Decision Making Amount and/or Complexity of Data  Reviewed Radiology: ordered.  Risk OTC drugs. Prescription drug management.   Camila Whinery 48 y.o. presented today for URI like symptoms. Working DDx that I considered at this time includes, but not limited to, viral illness, pharyngitis, mono, sinusitis, electrolyte abnormality, AOM.  R/o DDx: these additional diagnoses are not consistent with patient's history, presentation, physical exam, labs/imaging findings.  Review of prior external notes: None  Labs:  Respiratory Panel: Neg  Imaging:  Neg  Problem List / ED Course / Critical interventions / Medication management  Reporting to emergency room with upper respiratory tract symptoms.  She has negative respiratory panel.  Her imaging is negative for pneumonia or other acute pathology.  Patient is alert oriented and answering questions appropriately.  She is hemodynamically stable.  She does have low-grade fever in which I will treat with Tylenol.  Patient is not having shortness of breath and no chest x-ray thus do not feel she needs further workup in the emergency room.  Will have her follow-up with her primary care to ensure improvement of symptoms. Given patient's duration of symptoms, smoking history will treat with steroids, albuterol inhaler and antibiotic since she is starting to develop a productive cough.  She is not currently feeling short of breath and no sign of respiratory distress on exam but will supply with albuterol inhaler to use as needed if symptoms do occur. Encouraged smoking cessation. patient given return precautions. I ordered medication including Tylenol  Reevaluation of the patient after these medicines showed that the patient improved Patients vitals assessed. Upon arrival patient is hemodynamically stable.  I have reviewed the patients home medicines and have made adjustments as needed     Plan:  F/u w/ PCP in 2-3d to ensure resolution of sx.  Patient was given return precautions. Patient stable  for discharge at this time.  Patient educated on sx and dx and verbalized understanding of plan. Return to ER if new or worsening sx.          Final Clinical Impression(s) / ED Diagnoses Final diagnoses:  Bronchitis    Rx / DC Orders ED Discharge Orders     None         Smitty Knudsen, PA-C 01/04/24 2154    Virgina Norfolk, DO 01/04/24 2303
